# Patient Record
Sex: Male | Born: 1990
Health system: Southern US, Community
[De-identification: ages and names within clinical notes are randomized; demographics above are authoritative.]

## PROBLEM LIST (undated history)

## (undated) HISTORY — PX: OTHER SURGICAL HISTORY: SHX169

---
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
## 2012-08-20 ENCOUNTER — Emergency Department (HOSPITAL_COMMUNITY)
Admission: EM | Admit: 2012-08-20 | Discharge: 2012-08-20 | Disposition: A | Discharge status: Home / Self Care | Payer: BC Managed Care – PPO | Attending: Emergency Medicine | Admitting: Emergency Medicine

## 2012-08-20 ENCOUNTER — Encounter (HOSPITAL_COMMUNITY): Payer: Self-pay | Admitting: *Deleted

## 2012-08-20 ENCOUNTER — Emergency Department (HOSPITAL_COMMUNITY): Payer: BC Managed Care – PPO

## 2012-08-20 DIAGNOSIS — S6990XA Unspecified injury of unspecified wrist, hand and finger(s), initial encounter: Secondary | ICD-10-CM

## 2012-08-20 DIAGNOSIS — Y929 Unspecified place or not applicable: Secondary | ICD-10-CM | POA: Insufficient documentation

## 2012-08-20 DIAGNOSIS — W3302XA Accidental discharge of hunting rifle, initial encounter: Secondary | ICD-10-CM | POA: Insufficient documentation

## 2012-08-20 DIAGNOSIS — S61209A Unspecified open wound of unspecified finger without damage to nail, initial encounter: Secondary | ICD-10-CM | POA: Insufficient documentation

## 2012-08-20 DIAGNOSIS — Y939 Activity, unspecified: Secondary | ICD-10-CM | POA: Insufficient documentation

## 2012-08-20 DIAGNOSIS — F17200 Nicotine dependence, unspecified, uncomplicated: Secondary | ICD-10-CM | POA: Insufficient documentation

## 2012-08-20 MED ORDER — DOXYCYCLINE HYCLATE 100 MG PO CAPS: 100.0000 mg | ORAL_CAPSULE | Freq: Two times a day (BID) | ORAL | Status: DC

## 2012-08-20 MED ORDER — HYDROCODONE-ACETAMINOPHEN 5-325 MG PO TABS
1.0000 | ORAL_TABLET | Freq: Once | ORAL | Status: AC
  Administered 2012-08-20: 1 via ORAL
  Filled 2012-08-20: qty 1

## 2012-08-20 MED ORDER — TRAMADOL HCL 50 MG PO TABS: ORAL_TABLET | ORAL | Status: DC

## 2012-08-20 NOTE — ED Notes (Signed)
Pt dc to home with family.  Pt states understanding to dc paperwork and instructions.  Pt ambulatory to exit without difficulty.  Pt denies need for w/c.
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
## 2012-08-20 NOTE — ED Notes (Signed)
[facility] contacted regarding pt.  They will be coming to speak to pt shortly.  Finger cleaned and wrapped.
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
## 2012-08-20 NOTE — ED Notes (Signed)
[facility] here to speak with pt.

## 2012-08-20 NOTE — ED Notes (Signed)
PT TAKEN TO RADIOLOGY

## 2013-08-14 ENCOUNTER — Ambulatory Visit (INDEPENDENT_AMBULATORY_CARE_PROVIDER_SITE_OTHER): Payer: BC Managed Care – PPO | Admitting: Cardiovascular Disease

## 2013-08-14 ENCOUNTER — Encounter: Payer: Self-pay | Admitting: Cardiovascular Disease

## 2013-08-14 VITALS — BP 100/76 | HR 49 | Ht 69.0 in | Wt 158.0 lb

## 2013-08-14 DIAGNOSIS — R002 Palpitations: Secondary | ICD-10-CM | POA: Insufficient documentation

## 2013-08-14 NOTE — Assessment & Plan Note (Signed)
Noah MaduroRobert presents today for further evaluation and management of palpitations. Clinically, it sounds like he had premature ventricular contractions. He does very physical labor all along. He works for [employer] and lives thousand the pounds boxes during each shift.  The episodes of palpitations occurred when he had been n.p.o. and had his wisdom teeth pulled. His PO  was very poor during those several day.  He is now back on his normal diet he feels quite well.  At this point I do not think that he needs any additional workup. He's completely asymptomatic. If he has any further issues I instructed him to call me. See him on an as-needed basis.

## 2013-08-14 NOTE — Patient Instructions (Signed)
Your physician recommends that you schedule a follow-up appointment in: AS NEEDED BASIS  Your physician recommends that you continue on your current medications as directed. Please refer to the Current Medication list given to you today.    

## 2013-08-14 NOTE — Progress Notes (Signed)
     Noah Jenkins Date of Birth  10-26-1990       [facility]    [place] [facility] [address]  [address], [address] [place], [place]  [address]   [place], [place]  [id number] [phone]     [phone]   Fax  [phone]    Fax [phone]  Problem List: 1. Irregular HR  History of Present Illness:  The patient recently had his wisdom teeth pulled.  He was noticed to have HR irregularities on the monitor.  He has had 1 episode of palps associated with some chest discomfort.    This episode occurred after the wisdom teeth pulling - when he had limited PO intake.  He is back eating normally.  Is not working out yet.  He works at [employer].   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    No current outpatient prescriptions on file prior to visit.   No current facility-administered medications on file prior to visit.    No Known Allergies  No past medical history on file.  No past surgical history on file.  History  Smoking status  . Former Smoker  Smokeless tobacco  . Not on file    History  Alcohol Use  . Yes    No family history on file.  Reviw of Systems:  Reviewed in the HPI.  All other systems are negative.  Physical Exam: Blood pressure 100/76, pulse 49, height 5\' 9"  (1.753 m), weight 158 lb (71.668 kg). Wt Readings from Last 3 Encounters:  08/14/13 158 lb (71.668 kg)     General: Well developed, well nourished, in no acute distress. Head: Normocephalic, atraumatic, sclera non-icteric, mucus membranes are moist,  Neck: Supple. Carotids are 2 + without bruits. No JVD  Lungs: Clear  Heart: RR, S1, S2 Abdomen: Soft, non-tender, non-distended with normal bowel sounds. Msk:  Strength and tone are normal  Extremities: No clubbing or cyanosis. No edema.  Distal pedal pulses are 2+ and equal   Neuro: CN II - XII intact.  Alert and oriented X 3.  Psych:  Normal   ECG: Feb. 9, 2015:  Marked sinus brady at 49.   Otherwise normal.   Assessment / Plan:

## 2013-10-14 ENCOUNTER — Emergency Department (HOSPITAL_COMMUNITY)
Admission: EM | Admit: 2013-10-14 | Discharge: 2013-10-14 | Disposition: A | Discharge status: Home / Self Care | Payer: BC Managed Care – PPO | Attending: Emergency Medicine | Admitting: Emergency Medicine

## 2013-10-14 ENCOUNTER — Encounter (HOSPITAL_COMMUNITY): Payer: Self-pay | Admitting: Emergency Medicine

## 2013-10-14 ENCOUNTER — Emergency Department (HOSPITAL_COMMUNITY): Payer: BC Managed Care – PPO

## 2013-10-14 DIAGNOSIS — IMO0002 Reserved for concepts with insufficient information to code with codable children: Secondary | ICD-10-CM | POA: Insufficient documentation

## 2013-10-14 DIAGNOSIS — Z79899 Other long term (current) drug therapy: Secondary | ICD-10-CM | POA: Insufficient documentation

## 2013-10-14 DIAGNOSIS — Y9389 Activity, other specified: Secondary | ICD-10-CM | POA: Insufficient documentation

## 2013-10-14 DIAGNOSIS — F10929 Alcohol use, unspecified with intoxication, unspecified: Secondary | ICD-10-CM

## 2013-10-14 DIAGNOSIS — R51 Headache: Secondary | ICD-10-CM | POA: Insufficient documentation

## 2013-10-14 DIAGNOSIS — W1789XA Other fall from one level to another, initial encounter: Secondary | ICD-10-CM | POA: Insufficient documentation

## 2013-10-14 DIAGNOSIS — Y929 Unspecified place or not applicable: Secondary | ICD-10-CM | POA: Insufficient documentation

## 2013-10-14 DIAGNOSIS — W1809XA Striking against other object with subsequent fall, initial encounter: Secondary | ICD-10-CM | POA: Insufficient documentation

## 2013-10-14 DIAGNOSIS — F17200 Nicotine dependence, unspecified, uncomplicated: Secondary | ICD-10-CM | POA: Insufficient documentation

## 2013-10-14 DIAGNOSIS — F1010 Alcohol abuse, uncomplicated: Secondary | ICD-10-CM | POA: Insufficient documentation

## 2013-10-14 DIAGNOSIS — S80812A Abrasion, left lower leg, initial encounter: Secondary | ICD-10-CM

## 2013-10-14 DIAGNOSIS — W14XXXA Fall from tree, initial encounter: Secondary | ICD-10-CM

## 2013-10-14 DIAGNOSIS — S0101XA Laceration without foreign body of scalp, initial encounter: Secondary | ICD-10-CM

## 2013-10-14 DIAGNOSIS — S0100XA Unspecified open wound of scalp, initial encounter: Secondary | ICD-10-CM | POA: Insufficient documentation

## 2013-10-14 LAB — URINALYSIS, ROUTINE W REFLEX MICROSCOPIC
Bilirubin Urine: NEGATIVE
GLUCOSE, UA: NEGATIVE mg/dL
HGB URINE DIPSTICK: NEGATIVE
KETONES UR: NEGATIVE mg/dL
Leukocytes, UA: NEGATIVE
Nitrite: NEGATIVE
PROTEIN: NEGATIVE mg/dL
Specific Gravity, Urine: 1.011 (ref 1.005–1.030)
UROBILINOGEN UA: 0.2 mg/dL (ref 0.0–1.0)
pH: 6.5 (ref 5.0–8.0)

## 2013-10-14 LAB — BASIC METABOLIC PANEL
BUN: 10 mg/dL (ref 6–23)
CALCIUM: 9.4 mg/dL (ref 8.4–10.5)
CO2: 22 mEq/L (ref 19–32)
Chloride: 108 mEq/L (ref 96–112)
Creatinine, Ser: 1.11 mg/dL (ref 0.50–1.35)
Glucose, Bld: 122 mg/dL — ABNORMAL HIGH (ref 70–99)
POTASSIUM: 3.4 meq/L — AB (ref 3.7–5.3)
SODIUM: 147 meq/L (ref 137–147)

## 2013-10-14 LAB — CBC WITH DIFFERENTIAL/PLATELET
BASOS ABS: 0 10*3/uL (ref 0.0–0.1)
BASOS PCT: 0 % (ref 0–1)
Band Neutrophils: 0 % (ref 0–10)
Blasts: 0 %
EOS ABS: 0.2 10*3/uL (ref 0.0–0.7)
EOS PCT: 3 % (ref 0–5)
HCT: 40.9 % (ref 39.0–52.0)
HEMOGLOBIN: 15.3 g/dL (ref 13.0–17.0)
Lymphocytes Relative: 47 % — ABNORMAL HIGH (ref 12–46)
Lymphs Abs: 2.6 10*3/uL (ref 0.7–4.0)
MCH: 32.2 pg (ref 26.0–34.0)
MCHC: 37.4 g/dL — ABNORMAL HIGH (ref 30.0–36.0)
MCV: 86.1 fL (ref 78.0–100.0)
METAMYELOCYTES PCT: 0 %
MONO ABS: 0.3 10*3/uL (ref 0.1–1.0)
MONOS PCT: 6 % (ref 3–12)
MYELOCYTES: 0 %
NEUTROS ABS: 2.5 10*3/uL (ref 1.7–7.7)
Neutrophils Relative %: 44 % (ref 43–77)
Platelets: 219 10*3/uL (ref 150–400)
Promyelocytes Absolute: 0 %
RBC: 4.75 MIL/uL (ref 4.22–5.81)
RDW: 12 % (ref 11.5–15.5)
WBC: 5.6 10*3/uL (ref 4.0–10.5)
nRBC: 0 /100 WBC

## 2013-10-14 LAB — ETHANOL: Alcohol, Ethyl (B): 227 mg/dL — ABNORMAL HIGH (ref 0–11)

## 2013-10-14 MED ORDER — ONDANSETRON HCL 4 MG/2ML IJ SOLN
4.0000 mg | Freq: Once | INTRAMUSCULAR | Status: AC
  Administered 2013-10-14: 4 mg via INTRAVENOUS

## 2013-10-14 MED ORDER — ONDANSETRON HCL 4 MG/2ML IJ SOLN
INTRAMUSCULAR | Status: AC
  Administered 2013-10-14: 4 mg via INTRAVENOUS
  Filled 2013-10-14: qty 2

## 2013-10-14 MED ORDER — SODIUM CHLORIDE 0.9 % IV BOLUS (SEPSIS)
1000.0000 mL | Freq: Once | INTRAVENOUS | Status: AC
  Administered 2013-10-14: 1000 mL via INTRAVENOUS

## 2013-10-14 NOTE — Discharge Instructions (Signed)
Tylenol or Ibuprofen for pain.  Staples may be removed in 7-10 days.  Watch for signs of infection:  Redness, bleeding, drainage of pus.  Return to the ER for worsening condition or new concerning symptoms.   Abrasion An abrasion is a cut or scrape of the skin. Abrasions do not extend through all layers of the skin and most heal within 10 days. It is important to care for your abrasion properly to prevent infection. CAUSES  Most abrasions are caused by falling on, or gliding across, the ground or other surface. When your skin rubs on something, the outer and inner layer of skin rubs off, causing an abrasion. DIAGNOSIS  Your caregiver will be able to diagnose an abrasion during a physical exam.  TREATMENT  Your treatment depends on how large and deep the abrasion is. Generally, your abrasion will be cleaned with water and a mild soap to remove any dirt or debris. An antibiotic ointment may be put over the abrasion to prevent an infection. A bandage (dressing) may be wrapped around the abrasion to keep it from getting dirty.  You may need a tetanus shot if:  You cannot remember when you had your last tetanus shot.  You have never had a tetanus shot.  The injury broke your skin. If you get a tetanus shot, your arm may swell, get red, and feel warm to the touch. This is common and not a problem. If you need a tetanus shot and you choose not to have one, there is a rare chance of getting tetanus. Sickness from tetanus can be serious.  HOME CARE INSTRUCTIONS   If a dressing was applied, change it at least once a day or as directed by your caregiver. If the bandage sticks, soak it off with warm water.   Wash the area with water and a mild soap to remove all the ointment 2 times a day. Rinse off the soap and pat the area dry with a clean towel.   Reapply any ointment as directed by your caregiver. This will help prevent infection and keep the bandage from sticking. Use gauze over the wound and  under the dressing to help keep the bandage from sticking.   Change your dressing right away if it becomes wet or dirty.   Only take over-the-counter or prescription medicines for pain, discomfort, or fever as directed by your caregiver.   Follow up with your caregiver within 24 48 hours for a wound check, or as directed. If you were not given a wound-check appointment, look closely at your abrasion for redness, swelling, or pus. These are signs of infection. SEEK IMMEDIATE MEDICAL CARE IF:   You have increasing pain in the wound.   You have redness, swelling, or tenderness around the wound.   You have pus coming from the wound.   You have a fever or persistent symptoms for more than 2 3 days.  You have a fever and your symptoms suddenly get worse.  You have a bad smell coming from the wound or dressing.  MAKE SURE YOU:   Understand these instructions.  Will watch your condition.  Will get help right away if you are not doing well or get worse. Document Released: 04/01/2005 Document Revised: 06/08/2012 Document Reviewed: 05/26/2011 [facility] Patient Information 2014 [place], [place].  Alcohol Intoxication Alcohol intoxication occurs when the amount of alcohol that a person has consumed impairs his or her ability to mentally and physically function. Alcohol directly impairs the normal chemical activity of  the brain. Drinking large amounts of alcohol can lead to changes in mental function and behavior, and it can cause many physical effects that can be harmful.  Alcohol intoxication can range in severity from mild to very severe. Various factors can affect the level of intoxication that occurs, such as the person's age, gender, weight, frequency of alcohol consumption, and the presence of other medical conditions (such as diabetes, seizures, or heart conditions). Dangerous levels of alcohol intoxication may occur when people drink large amounts of alcohol in a short period (binge  drinking). Alcohol can also be especially dangerous when combined with certain prescription medicines or "recreational" drugs. SIGNS AND SYMPTOMS Some common signs and symptoms of mild alcohol intoxication include:  Loss of coordination.  Changes in mood and behavior.  Impaired judgment.  Slurred speech. As alcohol intoxication progresses to more severe levels, other signs and symptoms will appear. These may include:  Vomiting.  Confusion and impaired memory.  Slowed breathing.  Seizures.  Loss of consciousness. DIAGNOSIS  Your health care provider will take a medical history and perform a physical exam. You will be asked about the amount and type of alcohol you have consumed. Blood tests will be done to measure the concentration of alcohol in your blood. In many places, your blood alcohol level must be lower than 80 mg/dL (3.66%0.08%) to legally drive. However, many dangerous effects of alcohol can occur at much lower levels.  TREATMENT  People with alcohol intoxication often do not require treatment. Most of the effects of alcohol intoxication are temporary, and they go away as the alcohol naturally leaves the body. Your health care provider will monitor your condition until you are stable enough to go home. Fluids are sometimes given through an IV access tube to help prevent dehydration.  HOME CARE INSTRUCTIONS  Do not drive after drinking alcohol.  Stay hydrated. Drink enough water and fluids to keep your urine clear or pale yellow. Avoid caffeine.   Only take over-the-counter or prescription medicines as directed by your health care provider.  SEEK MEDICAL CARE IF:   You have persistent vomiting.   You do not feel better after a few days.  You have frequent alcohol intoxication. Your health care provider can help determine if you should see a substance use treatment counselor. SEEK IMMEDIATE MEDICAL CARE IF:   You become shaky or tremble when you try to stop drinking.    You shake uncontrollably (seizure).   You throw up (vomit) blood. This may be bright red or may look like black coffee grounds.   You have blood in your stool. This may be bright red or may appear as a black, tarry, bad smelling stool.   You become lightheaded or faint.  MAKE SURE YOU:   Understand these instructions.  Will watch your condition.  Will get help right away if you are not doing well or get worse. Document Released: 04/01/2005 Document Revised: 02/22/2013 Document Reviewed: 11/25/2012 [facility] Patient Information 2014 [place], [place].  Staple Wound Closure Staples are used to help a wound heal faster by holding the edges of the wound together. HOME CARE  Keep the area around the staples clean and dry.  Rest and raise (elevate) the injured part above the level of your heart.  See your doctor for a follow-up check of the wound.  See your doctor to have the staples removed.  Clean the wound daily with water.  Do not soak the wound in water for long periods of time.  Let air reach the wound as it heals. GET HELP RIGHT AWAY IF:   You have redness or puffiness around the wound.  You have a red line going away from the wound.  You have more pain or tenderness.  You have yellowish-white fluid (pus) coming from the wound.  Your wound does not stay together after the staples have been taken out.  You see something coming out of the wound, such as wood or glass.  You have problems moving the injured area.  You have a fever or lasting symptoms for more than 2-3 days.  You have a fever and your symptoms suddenly get worse. MAKE SURE YOU:   Understand these instructions.  Will watch this condition.  Will get help right away if you are not doing well or get worse. Document Released: 03/31/2008 Document Revised: 03/16/2012 Document Reviewed: 01/03/2012 [facility] Patient Information 2014 [place], [place].
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
## 2014-06-28 ENCOUNTER — Ambulatory Visit (INDEPENDENT_AMBULATORY_CARE_PROVIDER_SITE_OTHER): Payer: BC Managed Care – PPO | Admitting: Physician Assistant

## 2014-06-28 VITALS — BP 124/78 | HR 80 | Temp 99.0°F | Resp 16 | Ht 68.5 in | Wt 157.0 lb

## 2014-06-28 DIAGNOSIS — R070 Pain in throat: Secondary | ICD-10-CM

## 2014-06-28 LAB — POCT RAPID STREP A (OFFICE): RAPID STREP A SCREEN: NEGATIVE

## 2014-06-28 MED ORDER — MAGIC MOUTHWASH W/LIDOCAINE: 5.0000 mL | ORAL | Status: AC | PRN

## 2014-06-28 NOTE — Patient Instructions (Signed)
Upper Respiratory Infection, Adult An upper respiratory infection (URI) is also sometimes known as the common cold. The upper respiratory tract includes the nose, sinuses, throat, trachea, and bronchi. Bronchi are the airways leading to the lungs. Most people improve within 1 week, but symptoms can last up to 2 weeks. A residual cough may last even longer.  CAUSES Many different viruses can infect the tissues lining the upper respiratory tract. The tissues become irritated and inflamed and often become very moist. Mucus production is also common. A cold is contagious. You can easily spread the virus to others by oral contact. This includes kissing, sharing a glass, coughing, or sneezing. Touching your mouth or nose and then touching a surface, which is then touched by another person, can also spread the virus. SYMPTOMS  Symptoms typically develop 1 to 3 days after you come in contact with a cold virus. Symptoms vary from person to person. They may include:  Runny nose.  Sneezing.  Nasal congestion.  Sinus irritation.  Sore throat.  Loss of voice (laryngitis).  Cough.  Fatigue.  Muscle aches.  Loss of appetite.  Headache.  Low-grade fever. DIAGNOSIS  You might diagnose your own cold based on familiar symptoms, since most people get a cold 2 to 3 times a year. Your caregiver can confirm this based on your exam. Most importantly, your caregiver can check that your symptoms are not due to another disease such as strep throat, sinusitis, pneumonia, asthma, or epiglottitis. Blood tests, throat tests, and X-rays are not necessary to diagnose a common cold, but they may sometimes be helpful in excluding other more serious diseases. Your caregiver will decide if any further tests are required. RISKS AND COMPLICATIONS  You may be at risk for a more severe case of the common cold if you smoke cigarettes, have chronic heart disease (such as heart failure) or lung disease (such as asthma), or if  you have a weakened immune system. The very young and very old are also at risk for more serious infections. Bacterial sinusitis, middle ear infections, and bacterial pneumonia can complicate the common cold. The common cold can worsen asthma and chronic obstructive pulmonary disease (COPD). Sometimes, these complications can require emergency medical care and may be life-threatening. PREVENTION  The best way to protect against getting a cold is to practice good hygiene. Avoid oral or hand contact with people with cold symptoms. Wash your hands often if contact occurs. There is no clear evidence that vitamin C, vitamin E, echinacea, or exercise reduces the chance of developing a cold. However, it is always recommended to get plenty of rest and practice good nutrition. TREATMENT  Treatment is directed at relieving symptoms. There is no cure. Antibiotics are not effective, because the infection is caused by a virus, not by bacteria. Treatment may include:  Increased fluid intake. Sports drinks offer valuable electrolytes, sugars, and fluids.  Breathing heated mist or steam (vaporizer or shower).  Eating chicken soup or other clear broths, and maintaining good nutrition.  Getting plenty of rest.  Using gargles or lozenges for comfort.  Controlling fevers with ibuprofen or acetaminophen as directed by your caregiver.  Increasing usage of your inhaler if you have asthma. Zinc gel and zinc lozenges, taken in the first 24 hours of the common cold, can shorten the duration and lessen the severity of symptoms. Pain medicines may help with fever, muscle aches, and throat pain. A variety of non-prescription medicines are available to treat congestion and runny nose. Your caregiver   can make recommendations and may suggest nasal or lung inhalers for other symptoms.  HOME CARE INSTRUCTIONS   Only take over-the-counter or prescription medicines for pain, discomfort, or fever as directed by your  caregiver.  Use a warm mist humidifier or inhale steam from a shower to increase air moisture. This may keep secretions moist and make it easier to breathe.  Drink enough water and fluids to keep your urine clear or pale yellow.  Rest as needed.  Return to work when your temperature has returned to normal or as your caregiver advises. You may need to stay home longer to avoid infecting others. You can also use a face mask and careful hand washing to prevent spread of the virus. SEEK MEDICAL CARE IF:   After the first few days, you feel you are getting worse rather than better.  You need your caregiver's advice about medicines to control symptoms.  You develop chills, worsening shortness of breath, or brown or red sputum. These may be signs of pneumonia.  You develop yellow or brown nasal discharge or pain in the face, especially when you bend forward. These may be signs of sinusitis.  You develop a fever, swollen neck glands, pain with swallowing, or white areas in the back of your throat. These may be signs of strep throat. SEEK IMMEDIATE MEDICAL CARE IF:   You have a fever.  You develop severe or persistent headache, ear pain, sinus pain, or chest pain.  You develop wheezing, a prolonged cough, cough up blood, or have a change in your usual mucus (if you have chronic lung disease).  You develop sore muscles or a stiff neck. Document Released: 12/16/2000 Document Revised: 09/14/2011 Document Reviewed: 09/27/2013 [employer] Patient Information 2015 [employer]. This information is not intended to replace advice given to you by your health care provider. Make sure you discuss any questions you have with your health care provider.  

## 2014-06-28 NOTE — Progress Notes (Signed)
MRN: [id number] DOB: 1990-11-28  Subjective:   Chief Complaint  Patient presents with  . Fever    up to 100.7  . Sore Throat    started yesterday    Noah Jenkins is a 23 y.o. male presenting for 2 days of sore throat.  He states that it began with some body aches and general soreness.  Yesterday, had significant pain in his throat.  He also had fever around 100.7 by late afternoon.  Throat pain worsened throughout the day.  He has been able to eat, but has some associated nausea, but no vomiting or diarrhea.  He also has some stuffy nose with dark yellow sputum.  Yesterday he took tylenol which helped.  Hx of strepthroat as child.  No known sick contacts.  He has ear fullness with the sensation that his ears are being pushed out.  He has some nausea but no vomiting or diarrhea.  He states that he has some sob, but no dizziness.    2 weeks smoke cessation.  Hx smoker of 4 cigarettes per day  Denies any other aggravating or relieving factors, no other questions or concerns.  Noah Jenkins currently has no medications in their medication list.  He has No Known Allergies.  Noah Jenkins  has no past medical history on file. Also  has no past surgical history on file.  ROS As in subjective.  Objective:   Vitals: BP 124/78 mmHg  Pulse 80  Temp(Src) 99 F (37.2 C) (Oral)  Resp 16  Ht 5' 8.5" (1.74 m)  Wt 157 lb (71.215 kg)  BMI 23.52 kg/m2  SpO2 99%  Physical Exam  Constitutional: He is well-developed, well-nourished, and in no distress. No distress.  HENT:  Right Ear: Tympanic membrane is not injected and not bulging. No middle ear effusion.  Left Ear: Tympanic membrane is not injected and not bulging.  No middle ear effusion.  Nose: Mucosal edema and rhinorrhea present.  Mouth/Throat: No trismus in the jaw. No uvula swelling. Posterior oropharyngeal edema and posterior oropharyngeal erythema present. No oropharyngeal exudate.  Cardiovascular: Normal rate and regular rhythm.  Exam  reveals no gallop, no distant heart sounds and no friction rub.   No murmur heard. Pulmonary/Chest: Effort normal and breath sounds normal. No accessory muscle usage. No apnea. No respiratory distress. He has no wheezes.  Abdominal: Soft. Bowel sounds are normal. There is no hepatosplenomegaly. There is tenderness (Mild ruq tenderness.). There is negative Murphy's sign.  Lymphadenopathy:       Head (right side): No submental and no submandibular adenopathy present.       Head (left side): Tonsillar adenopathy present. No submental and no submandibular adenopathy present.       Right cervical: No superficial cervical and no posterior cervical adenopathy present.      Left cervical: No superficial cervical and no posterior cervical adenopathy present.  Skin: Skin is warm, dry and intact.  Psychiatric: Mood, memory, affect and judgment normal.    Results for orders placed or performed in visit on 06/28/14  POCT rapid strep A  Result Value Ref Range   Rapid Strep A Screen Negative Negative      Assessment and Plan :   23 year old male with pmh listed above is here today for chief complaint of sorethroat and nasal congestion that began 2 days ago.  This appears to be viral in etiology.  Will treat him supportively with mouthwash w/lidocaine.  Also advised to have mucinex and other supportive  measures.  Will rtc if worsens or not improved in 8 days.  Throat culture sent.  Temperature upon exiting was 98.2 oral.    Throat pain - Plan: POCT rapid strep A, Throat culture Loney Loh([facility]), Alum & Mag Hydroxide-Simeth (MAGIC MOUTHWASH W/LIDOCAINE) SOLN  Trena PlattStephanie English, PA-C [facility] 12/24/20152:47 PM

## 2014-06-30 ENCOUNTER — Encounter: Payer: Self-pay | Admitting: Physician Assistant

## 2014-07-02 ENCOUNTER — Telehealth: Payer: Self-pay | Admitting: Physician Assistant

## 2014-07-02 LAB — CULTURE, GROUP A STREP: Organism ID, Bacteria: NORMAL

## 2014-07-02 NOTE — Telephone Encounter (Signed)
Spoke to patient regarding negative throat culture.  Patient states that he is feeling much better at day 6 of presumed ur virus.  He continues to have some throat pain and congestion, but is clearing up each day.  He denies fever.  Advised to rtc if worsening symptoms.

## 2018-03-25 ENCOUNTER — Other Ambulatory Visit: Payer: Self-pay

## 2018-03-25 ENCOUNTER — Encounter (HOSPITAL_BASED_OUTPATIENT_CLINIC_OR_DEPARTMENT_OTHER): Payer: Self-pay | Admitting: *Deleted

## 2018-03-25 ENCOUNTER — Emergency Department (HOSPITAL_BASED_OUTPATIENT_CLINIC_OR_DEPARTMENT_OTHER)
Admission: EM | Admit: 2018-03-25 | Discharge: 2018-03-25 | Disposition: A | Discharge status: Home / Self Care | Payer: Self-pay | Attending: Emergency Medicine | Admitting: Emergency Medicine

## 2018-03-25 ENCOUNTER — Emergency Department (HOSPITAL_BASED_OUTPATIENT_CLINIC_OR_DEPARTMENT_OTHER): Payer: Self-pay

## 2018-03-25 DIAGNOSIS — F17210 Nicotine dependence, cigarettes, uncomplicated: Secondary | ICD-10-CM | POA: Insufficient documentation

## 2018-03-25 DIAGNOSIS — N10 Acute tubulo-interstitial nephritis: Secondary | ICD-10-CM | POA: Insufficient documentation

## 2018-03-25 DIAGNOSIS — R1084 Generalized abdominal pain: Secondary | ICD-10-CM | POA: Insufficient documentation

## 2018-03-25 LAB — URINALYSIS, MICROSCOPIC (REFLEX)

## 2018-03-25 LAB — CBC WITH DIFFERENTIAL/PLATELET
Basophils Absolute: 0 10*3/uL (ref 0.0–0.1)
Basophils Relative: 0 %
EOS ABS: 0.1 10*3/uL (ref 0.0–0.7)
EOS PCT: 1 %
HCT: 42 % (ref 39.0–52.0)
Hemoglobin: 15.5 g/dL (ref 13.0–17.0)
LYMPHS ABS: 1.9 10*3/uL (ref 0.7–4.0)
LYMPHS PCT: 12 %
MCH: 32 pg (ref 26.0–34.0)
MCHC: 36.9 g/dL — AB (ref 30.0–36.0)
MCV: 86.6 fL (ref 78.0–100.0)
MONO ABS: 1.4 10*3/uL — AB (ref 0.1–1.0)
MONOS PCT: 9 %
Neutro Abs: 12.1 10*3/uL — ABNORMAL HIGH (ref 1.7–7.7)
Neutrophils Relative %: 78 %
PLATELETS: 281 10*3/uL (ref 150–400)
RBC: 4.85 MIL/uL (ref 4.22–5.81)
RDW: 12.1 % (ref 11.5–15.5)
WBC: 15.4 10*3/uL — AB (ref 4.0–10.5)

## 2018-03-25 LAB — BASIC METABOLIC PANEL
Anion gap: 10 (ref 5–15)
BUN: 13 mg/dL (ref 6–20)
CO2: 25 mmol/L (ref 22–32)
CREATININE: 0.96 mg/dL (ref 0.61–1.24)
Calcium: 9.4 mg/dL (ref 8.9–10.3)
Chloride: 104 mmol/L (ref 98–111)
GFR calc Af Amer: >60 mL/min (ref >60)
Glucose, Bld: 101 mg/dL — ABNORMAL HIGH (ref 70–99)
POTASSIUM: 3.5 mmol/L (ref 3.5–5.1)
SODIUM: 139 mmol/L (ref 135–145)

## 2018-03-25 LAB — URINALYSIS, ROUTINE W REFLEX MICROSCOPIC
Bilirubin Urine: NEGATIVE
GLUCOSE, UA: 100 mg/dL — AB
Ketones, ur: NEGATIVE mg/dL
Nitrite: POSITIVE — AB
PH: 8 (ref 5.0–8.0)
PROTEIN: 100 mg/dL — AB
Specific Gravity, Urine: 1.01 (ref 1.005–1.030)

## 2018-03-25 MED ORDER — CEPHALEXIN 500 MG PO CAPS: 500.0000 mg | ORAL_CAPSULE | Freq: Four times a day (QID) | ORAL | 0 refills | Status: AC

## 2018-03-25 MED ORDER — ACETAMINOPHEN 500 MG PO TABS
1000.0000 mg | ORAL_TABLET | Freq: Once | ORAL | Status: AC
  Administered 2018-03-25: 1000 mg via ORAL
  Filled 2018-03-25: qty 2

## 2018-03-25 MED FILL — CEPHALEXIN 500 MG CAPSULE: 500 | 7 days supply | Qty: 28 | Fill #0

## 2018-03-25 NOTE — ED Provider Notes (Signed)
[facility] EMERGENCY DEPARTMENT Provider Note   CSN: [id number] Arrival date & time: 03/25/18  1001     History   Chief Complaint Chief Complaint  Patient presents with  . Back Pain    HPI Noah Jenkins is a 27 y.o. male.  The history is provided by the patient.  Dysuria   This is a new problem. The current episode started 2 days ago. The problem occurs intermittently. The problem has not changed since onset.The quality of the pain is described as burning. The pain is at a severity of 2/10. The pain is mild. There has been no fever. The fever has been present for less than 1 day. He is sexually active. There is no history of pyelonephritis. Associated symptoms include flank pain. Pertinent negatives include no chills, no sweats, no nausea, no vomiting, no discharge, no frequency, no hematuria, no hesitancy, no possible pregnancy and no urgency. Treatments tried: urostat. His past medical history does not include kidney stones or recurrent UTIs.    History reviewed. No pertinent past medical history.  Patient Active Problem List   Diagnosis Date Noted  . Palpitations 08/14/2013    Past Surgical History:  Procedure Laterality Date  . RIght Knee minicus repair          Home Medications    Prior to Admission medications   Medication Sig Start Date End Date Taking? Authorizing Provider  Alum & Mag Hydroxide-Simeth (MAGIC MOUTHWASH W/LIDOCAINE) SOLN Take 5 mLs by mouth every 2 (two) hours as needed for mouth pain. 06/28/14   Trena PlattEnglish, Stephanie D, PA  cephALEXin (KEFLEX) 500 MG capsule Take 1 capsule (500 mg total) by mouth 4 (four) times daily for 7 days. 03/25/18 04/01/18  Virgina Norfolkuratolo, Arran Fessel, DO    Family History History reviewed. No pertinent family history.  Social History Social History   Tobacco Use  . Smoking status: Current Every Day Smoker    Packs/day: 0.50    Types: Cigarettes    Last attempt to quit: 09/27/2013    Years since quitting: 4.4  .  Smokeless tobacco: Never Used  . Tobacco comment: 1 ppd  Substance Use Topics  . Alcohol use: Yes    Alcohol/week: 3.0 - 4.0 standard drinks    Types: 3 - 4 Cans of beer per week    Comment: daily  . Drug use: Not Currently    Types: Marijuana    Comment: not in 2.5 weeks     Allergies   Patient has no known allergies.   Review of Systems Review of Systems  Constitutional: Negative for chills and fever.  HENT: Negative for ear pain and sore throat.   Eyes: Negative for pain and visual disturbance.  Respiratory: Negative for cough and shortness of breath.   Cardiovascular: Negative for chest pain and palpitations.  Gastrointestinal: Negative for abdominal pain, nausea and vomiting.  Genitourinary: Positive for dysuria and flank pain. Negative for difficulty urinating, discharge, frequency, hematuria, hesitancy, penile pain, penile swelling, scrotal swelling, testicular pain and urgency.  Musculoskeletal: Negative for arthralgias and back pain.  Skin: Negative for color change and rash.  Neurological: Negative for seizures and syncope.  All other systems reviewed and are negative.    Physical Exam Updated Vital Signs  ED Triage Vitals  Enc Vitals Group     BP 03/25/18 1007 132/85     Pulse Rate 03/25/18 1007 86     Resp 03/25/18 1007 18     Temp 03/25/18 1007 98.4 F (36.9 C)  Temp Source 03/25/18 1007 Oral     SpO2 03/25/18 1007 100 %     Weight 03/25/18 1005 185 lb (83.9 kg)     Height 03/25/18 1005 5\' 9"  (1.753 m)     Head Circumference --      Peak Flow --      Pain Score --      Pain Loc --      Pain Edu? --      Excl. in GC? --     Physical Exam  Constitutional: He is oriented to person, place, and time. He appears well-developed and well-nourished.  HENT:  Head: Normocephalic and atraumatic.  Mouth/Throat: No oropharyngeal exudate.  Eyes: Pupils are equal, round, and reactive to light. Conjunctivae and EOM are normal.  Neck: Normal range of motion.  Neck supple.  Cardiovascular: Normal rate, regular rhythm, normal heart sounds and intact distal pulses.  No murmur heard. Pulmonary/Chest: Effort normal and breath sounds normal. No respiratory distress.  Abdominal: Soft. He exhibits no distension. There is no tenderness.  Musculoskeletal: Normal range of motion. He exhibits tenderness (TTP in right CVA). He exhibits no edema.  Neurological: He is alert and oriented to person, place, and time.  Skin: Skin is warm and dry. Capillary refill takes less than 2 seconds.  Psychiatric: He has a normal mood and affect.  Nursing note and vitals reviewed.    ED Treatments / Results  Labs (all labs ordered are listed, but only abnormal results are displayed) Labs Reviewed  URINALYSIS, ROUTINE W REFLEX MICROSCOPIC - Abnormal; Notable for the following components:      Result Value   Color, Urine ORANGE (*)    APPearance HAZY (*)    Glucose, UA 100 (*)    Hgb urine dipstick MODERATE (*)    Protein, ur 100 (*)    Nitrite POSITIVE (*)    Leukocytes, UA LARGE (*)    All other components within normal limits  URINALYSIS, MICROSCOPIC (REFLEX) - Abnormal; Notable for the following components:   Bacteria, UA MANY (*)    All other components within normal limits  CBC WITH DIFFERENTIAL/PLATELET - Abnormal; Notable for the following components:   WBC 15.4 (*)    MCHC 36.9 (*)    Neutro Abs 12.1 (*)    Monocytes Absolute 1.4 (*)    All other components within normal limits  BASIC METABOLIC PANEL - Abnormal; Notable for the following components:   Glucose, Bld 101 (*)    All other components within normal limits  URINE CULTURE    EKG None  Radiology Ct Renal Stone Study  Result Date: 03/25/2018 CLINICAL DATA:  Right flank pain and dysuria. EXAM: CT ABDOMEN AND PELVIS WITHOUT CONTRAST TECHNIQUE: Multidetector CT imaging of the abdomen and pelvis was performed following the standard protocol without IV contrast. COMPARISON:  None. FINDINGS:  Lower chest: No acute abnormality. Hepatobiliary: No focal liver abnormality is seen. No gallstones, gallbladder Smoot thickening, or biliary dilatation. Pancreas: Unremarkable. No pancreatic ductal dilatation or surrounding inflammatory changes. Spleen: Normal in size without focal abnormality. Adrenals/Urinary Tract: Adrenal glands are unremarkable. Kidneys are normal, without renal calculi, focal lesion, or hydronephrosis. Bladder is unremarkable. Stomach/Bowel: Stomach is within normal limits. Appendix appears normal. No evidence of bowel Tome thickening, distention, or inflammatory changes. Vascular/Lymphatic: No significant vascular findings are present. No enlarged abdominal or pelvic lymph nodes. Reproductive: Prostate is unremarkable. Other: Tiny fat containing left inguinal hernia. No free fluid or pneumoperitoneum. Musculoskeletal: No acute or significant osseous findings.  IMPRESSION: 1.  No acute intra-abdominal process.  No urolithiasis. Electronically Signed   By: Obie Dredge M.D.   On: 03/25/2018 11:27    Procedures Procedures (including critical care time)  Medications Ordered in ED Medications  acetaminophen (TYLENOL) tablet 1,000 mg (1,000 mg Oral Given 03/25/18 1100)     Initial Impression / Assessment and Plan / ED Course  I have reviewed the triage vital signs and the nursing notes.  Pertinent labs & imaging results that were available during my care of the patient were reviewed by me and considered in my medical decision making (see chart for details).     Noah Jenkins is a 27 year old male with no significant medical history who presents to the ED with pain with urination, right flank pain.  Patient with normal vitals.  No fever.  Patient with symptoms for the last several days.  No history of kidney stones.  But has right CVA tenderness on exam.  Otherwise overall well-appearing.  Lab work shows leukocytosis and urinary tract infection.  CT scan did not show any signs of  kidney stones.  Overall uncomplicated pyonephritis.  Patient is able to tolerate p.o.  Had no significant anemia, electrolyte abnormality.  Will give Keflex and recommend follow-up with primary care doctor and discharged in ED in good condition.  This chart was dictated using voice recognition software.  Despite best efforts to proofread,  errors can occur which can change the documentation meaning.   Final Clinical Impressions(s) / ED Diagnoses   Final diagnoses:  Acute pyelonephritis    ED Discharge Orders         Ordered    cephALEXin (KEFLEX) 500 MG capsule  4 times daily     03/25/18 1212           Braelin Costlow, DO 03/25/18 1340

## 2018-03-25 NOTE — ED Triage Notes (Signed)
Right flank pain and frequent burning sensation with urination started yesterday.  Took Urostat OTC yesterday.

## 2018-03-27 LAB — URINE CULTURE

## 2018-03-28 ENCOUNTER — Telehealth: Payer: Self-pay | Admitting: Emergency Medicine

## 2018-03-28 NOTE — Telephone Encounter (Signed)
Post ED Visit - Positive Culture Follow-up  Culture report reviewed by antimicrobial stewardship pharmacist:  []  Enzo BiNathan Batchelder, Pharm.D. []  Celedonio MiyamotoJeremy Frens, Pharm.D., BCPS AQ-ID []  Garvin FilaMike Maccia, Pharm.D., BCPS []  Georgina PillionElizabeth Martin, Pharm.D., BCPS []  BuckholtsMinh Pham, VermontPharm.D., BCPS, AAHIVP []  Estella HuskMichelle Turner, Pharm.D., BCPS, AAHIVP []  Lysle Pearlachel Rumbarger, PharmD, BCPS []  Phillips Climeshuy Dang, PharmD, BCPS [x]  Agapito GamesAlison Masters, PharmD, BCPS []  Verlan FriendsErin Deja, PharmD  Positive urine culture Treated with cephalexin, organism sensitive to the same and no further patient follow-up is required at this time.  Berle MullMiller, Jamier Urbas 03/28/2018, 3:13 PM

## 2019-02-26 ENCOUNTER — Other Ambulatory Visit: Payer: Self-pay

## 2019-02-26 ENCOUNTER — Encounter (HOSPITAL_BASED_OUTPATIENT_CLINIC_OR_DEPARTMENT_OTHER): Payer: Self-pay | Admitting: Emergency Medicine

## 2019-02-26 ENCOUNTER — Emergency Department (HOSPITAL_BASED_OUTPATIENT_CLINIC_OR_DEPARTMENT_OTHER)
Admission: EM | Admit: 2019-02-26 | Discharge: 2019-02-26 | Disposition: A | Discharge status: Home / Self Care | Payer: BC Managed Care – PPO | Attending: Emergency Medicine | Admitting: Emergency Medicine

## 2019-02-26 ENCOUNTER — Emergency Department (HOSPITAL_BASED_OUTPATIENT_CLINIC_OR_DEPARTMENT_OTHER): Payer: BC Managed Care – PPO

## 2019-02-26 DIAGNOSIS — Y9389 Activity, other specified: Secondary | ICD-10-CM | POA: Insufficient documentation

## 2019-02-26 DIAGNOSIS — Y998 Other external cause status: Secondary | ICD-10-CM | POA: Insufficient documentation

## 2019-02-26 DIAGNOSIS — Z23 Encounter for immunization: Secondary | ICD-10-CM | POA: Insufficient documentation

## 2019-02-26 DIAGNOSIS — Y9289 Other specified places as the place of occurrence of the external cause: Secondary | ICD-10-CM | POA: Insufficient documentation

## 2019-02-26 DIAGNOSIS — F17210 Nicotine dependence, cigarettes, uncomplicated: Secondary | ICD-10-CM | POA: Insufficient documentation

## 2019-02-26 DIAGNOSIS — S61211A Laceration without foreign body of left index finger without damage to nail, initial encounter: Secondary | ICD-10-CM | POA: Diagnosis not present

## 2019-02-26 DIAGNOSIS — W260XXA Contact with knife, initial encounter: Secondary | ICD-10-CM | POA: Diagnosis not present

## 2019-02-26 MED ORDER — LIDOCAINE HCL (PF) 1 % IJ SOLN
5.0000 mL | Freq: Once | INTRAMUSCULAR | Status: AC
  Administered 2019-02-26: 5 mL
  Filled 2019-02-26: qty 5

## 2019-02-26 MED ORDER — BACITRACIN ZINC 500 UNIT/GM EX OINT
TOPICAL_OINTMENT | Freq: Two times a day (BID) | CUTANEOUS | Status: DC
  Administered 2019-02-26: 21:00:00 via TOPICAL

## 2019-02-26 MED ORDER — TETANUS-DIPHTH-ACELL PERTUSSIS 5-2.5-18.5 LF-MCG/0.5 IM SUSP
0.5000 mL | Freq: Once | INTRAMUSCULAR | Status: AC
  Administered 2019-02-26: 0.5 mL via INTRAMUSCULAR
  Filled 2019-02-26: qty 0.5

## 2019-02-26 NOTE — Discharge Instructions (Addendum)
Return in 7 to 9 days for suture removal. Return to the ED sooner if you start to have signs of infection including redness, red streaks, increased swelling, drainage from the area or fever.

## 2019-02-26 NOTE — ED Triage Notes (Signed)
Was using a new knife to cut watermelon and cut left index finger posteriorly. Bleeding controlled in triage.

## 2019-02-26 NOTE — ED Provider Notes (Signed)
[facility] Provider Note   CSN: [id number] Arrival date & time: 02/26/19  1911     History   Chief Complaint Chief Complaint  Patient presents with  . Laceration    HPI Noah Jenkins is a 28 y.o. male who presents to ED for nondominant left index finger laceration that he sustained with a kitchen knife.  States that he was using a very sharp knife to cut watermelon when he cut his finger near the PIP joint of the left index finger.  Able to wash with warm water.  States that tetanus is not up-to-date.  Denies any other complaints.     HPI  History reviewed. No pertinent past medical history.  Patient Active Problem List   Diagnosis Date Noted  . Palpitations 08/14/2013    Past Surgical History:  Procedure Laterality Date  . RIght Knee minicus repair          Home Medications    Prior to Admission medications   Medication Sig Start Date End Date Taking? Authorizing Provider  Alum & Mag Hydroxide-Simeth (MAGIC MOUTHWASH W/LIDOCAINE) SOLN Take 5 mLs by mouth every 2 (two) hours as needed for mouth pain. 06/28/14   Joretta Bachelor, PA    Family History No family history on file.  Social History Social History   Tobacco Use  . Smoking status: Current Every Day Smoker    Packs/day: 0.50    Types: Cigarettes    Last attempt to quit: 09/27/2013    Years since quitting: 5.4  . Smokeless tobacco: Never Used  . Tobacco comment: 1 ppd  Substance Use Topics  . Alcohol use: Yes    Alcohol/week: 3.0 - 4.0 standard drinks    Types: 3 - 4 Cans of beer per week    Comment: daily  . Drug use: Not Currently    Types: Marijuana    Comment: not in 2.5 weeks     Allergies   Patient has no known allergies.   Review of Systems Review of Systems  Constitutional: Negative for chills and fever.  Musculoskeletal: Negative for myalgias.  Skin: Positive for wound.  Neurological: Negative for weakness and numbness.     Physical Exam  Updated Vital Signs BP 134/72   Pulse 91   Temp 98 F (36.7 C) (Oral)   Resp 18   Ht 5\' 9"  (1.753 m)   Wt 86.2 kg   SpO2 98%   BMI 28.06 kg/m   Physical Exam Vitals signs and nursing note reviewed.  Constitutional:      General: He is not in acute distress.    Appearance: He is well-developed. He is not diaphoretic.  HENT:     Head: Normocephalic and atraumatic.  Eyes:     General: No scleral icterus.    Conjunctiva/sclera: Conjunctivae normal.  Neck:     Musculoskeletal: Normal range of motion.  Pulmonary:     Effort: Pulmonary effort is normal. No respiratory distress.  Skin:    Findings: No rash.     Comments: ~2cm linear laceration over the PIP joint of the L 2nd digit. Able to flex and extend digit without difficulty.  Sensation intact to light touch over digit. 2+ radial pulse noted.  Neurological:     Mental Status: He is alert.      ED Treatments / Results  Labs (all labs ordered are listed, but only abnormal results are displayed) Labs Reviewed - No data to display  EKG None  Radiology Dg  Hand Complete Left  Result Date: 02/26/2019 CLINICAL DATA:  Index finger laceration EXAM: LEFT HAND - COMPLETE 3+ VIEW COMPARISON:  None. FINDINGS: No fracture or dislocation of the left hand. Joint spaces are well preserved. Soft tissue laceration of the index digit with soft tissue edema. No radiopaque foreign body identified. IMPRESSION: No fracture or dislocation of the left hand. Joint spaces are well preserved. Soft tissue laceration of the index digit with soft tissue edema. No radiopaque foreign body identified. Electronically Signed   By: Lauralyn PrimesAlex  Bibbey M.D.   On: 02/26/2019 19:58    Procedures .Marland Kitchen.Laceration Repair  Date/Time: 02/26/2019 8:58 PM Performed by: Dietrich PatesKhatri, Macari Zalesky, PA-C Authorized by: Dietrich PatesKhatri, Amilyah Nack, PA-C   Consent:    Consent obtained:  Verbal   Consent given by:  Patient   Risks discussed:  Infection, need for additional repair, nerve damage, pain,  poor wound healing, poor cosmetic result, retained foreign body, tendon damage and vascular damage Anesthesia (see MAR for exact dosages):    Anesthesia method:  Local infiltration   Local anesthetic:  Lidocaine 1% w/o epi Laceration details:    Location:  Finger   Finger location:  L index finger   Length (cm):  2 Repair type:    Repair type:  Simple Exploration:    Hemostasis achieved with:  Direct pressure   Wound exploration: wound explored through full range of motion     Wound extent: no tendon damage noted and no underlying fracture noted   Treatment:    Area cleansed with:  Saline   Amount of cleaning:  Extensive   Irrigation solution:  Sterile saline   Irrigation method:  Pressure wash Skin repair:    Repair method:  Sutures   Suture size:  5-0   Suture material:  Nylon   Suture technique:  Simple interrupted   Number of sutures:  3 Approximation:    Approximation:  Close Post-procedure details:    Dressing:  Splint for protection and antibiotic ointment   (including critical care time)  Medications Ordered in ED Medications  bacitracin ointment (has no administration in time range)  lidocaine (PF) (XYLOCAINE) 1 % injection 5 mL (5 mLs Infiltration Given by Other 02/26/19 1948)  Tdap (BOOSTRIX) injection 0.5 mL (0.5 mLs Intramuscular Given 02/26/19 1947)     Initial Impression / Assessment and Plan / ED Course  I have reviewed the triage vital signs and the nursing notes.  Pertinent labs & imaging results that were available during my care of the patient were reviewed by me and considered in my medical decision making (see chart for details).        Patient counseled on wound care. Patient counseled on need to return or see PCP/urgent care for suture removal in 7 days. Patient was urged to return to the Emergency Department urgently with worsening pain, swelling, expanding erythema especially if it streaks away from the affected area, fever, or if they have any  other concerns. Patient verbalized understanding.   Patient is hemodynamically stable, in NAD, and able to ambulate in the ED. Evaluation does not show pathology that would require ongoing emergent intervention or inpatient treatment. I explained the diagnosis to the patient. Pain has been managed and has no complaints prior to discharge. Patient is comfortable with above plan and is stable for discharge at this time. All questions were answered prior to disposition. Strict return precautions for returning to the ED were discussed. Encouraged follow up with PCP.   An After Visit Summary was  printed and given to the patient.   Portions of this note were generated with Scientist, clinical (histocompatibility and immunogenetics)Dragon dictation software. Dictation errors may occur despite best attempts at proofreading.  Final Clinical Impressions(s) / ED Diagnoses   Final diagnoses:  Laceration of left index finger without foreign body without damage to nail, initial encounter    ED Discharge Orders    None       Dietrich PatesKhatri, Rosela Supak, PA-C 02/26/19 2059    Vanetta MuldersZackowski, Scott, MD 03/04/19 1230

## 2019-05-23 IMAGING — CT CT RENAL STONE PROTOCOL
2 of 4 series · 16 of 46 positions shown, 18 images · non-contrast
Comparison: None.

CLINICAL DATA: Right flank pain and dysuria.

EXAM:
CT ABDOMEN AND PELVIS WITHOUT CONTRAST
TECHNIQUE: Multidetector CT imaging of the abdomen and pelvis was performed
following the standard protocol without IV contrast.

[Series 2: axial st · axial · 0.76mm/px · z∈[+718,+1142]mm · 13 of 93 slices shown, 15 images]
[im 4/93  soft-tissue]
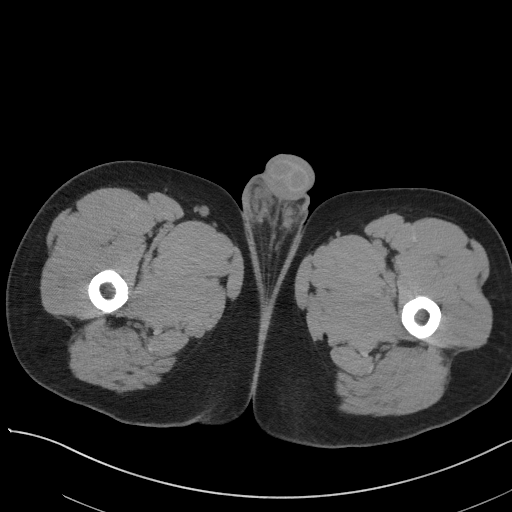
[im 4/93  bone]
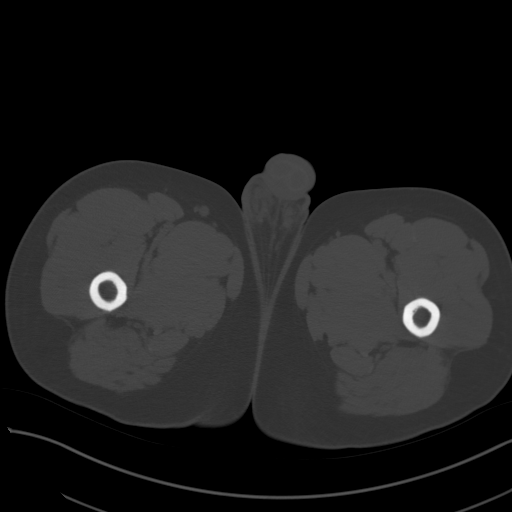
[im 11/93  soft-tissue]
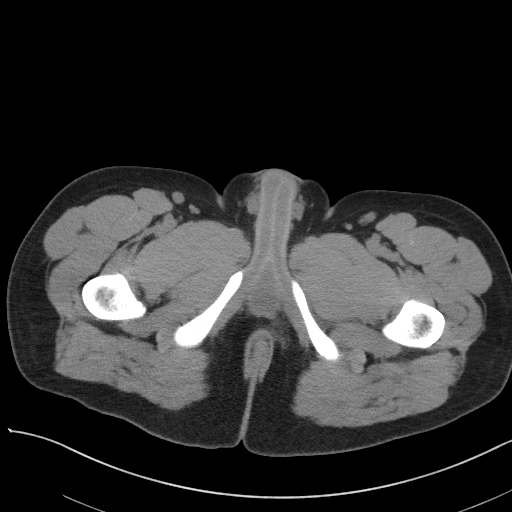
[im 18/93  soft-tissue]
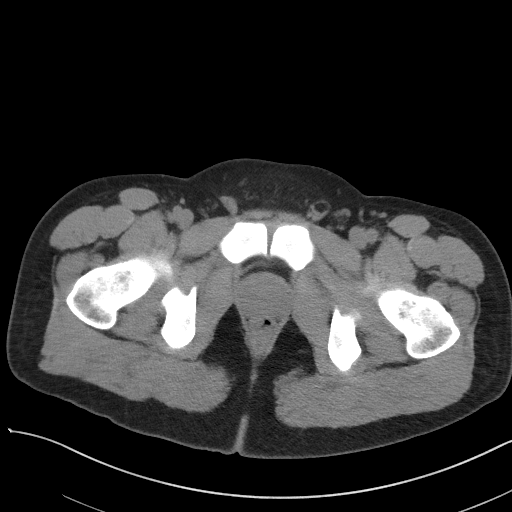
[im 25/93  soft-tissue]
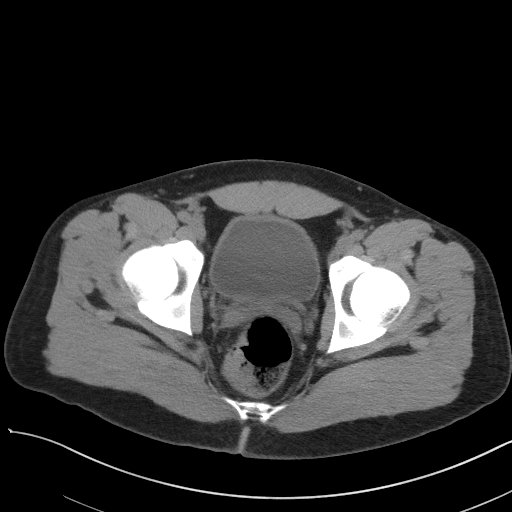
[im 32/93  soft-tissue]
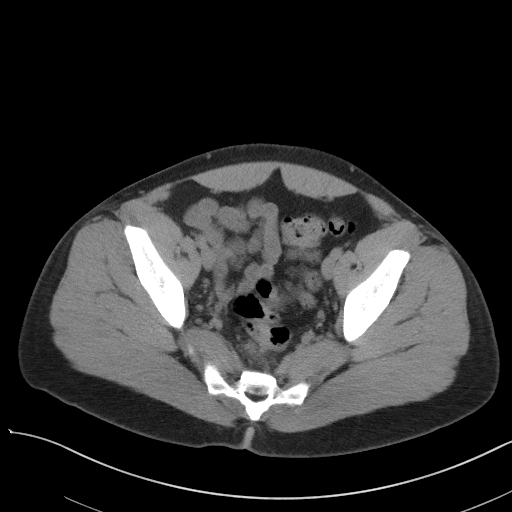
[im 39/93  soft-tissue]
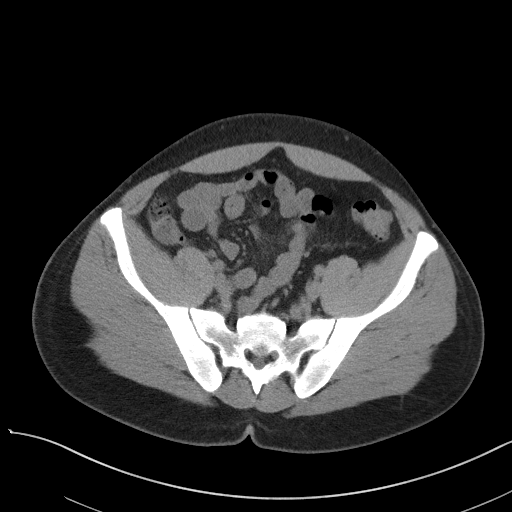
[im 47/93  soft-tissue]
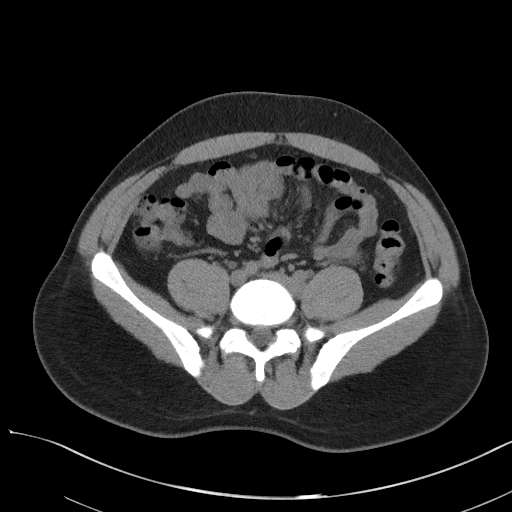
[im 54/93  soft-tissue]
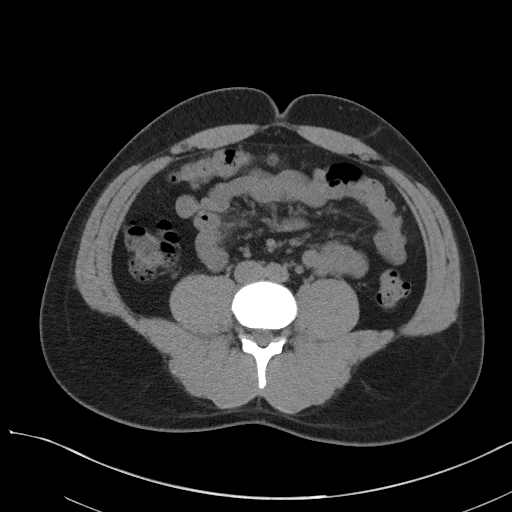
[im 61/93  soft-tissue]
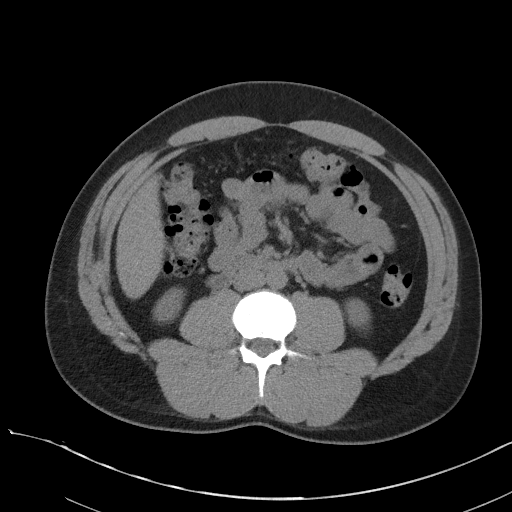
[im 61/93  bone]
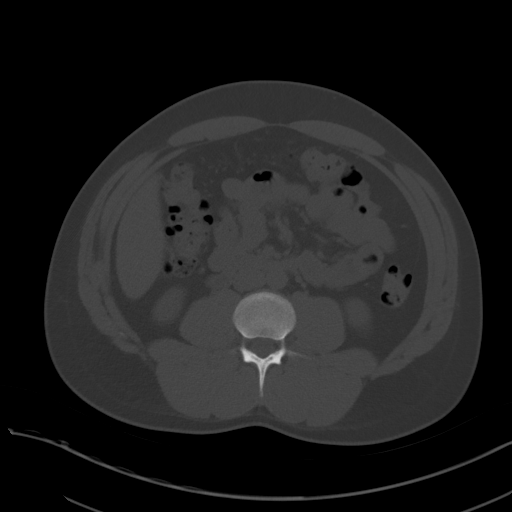
[im 68/93  soft-tissue]
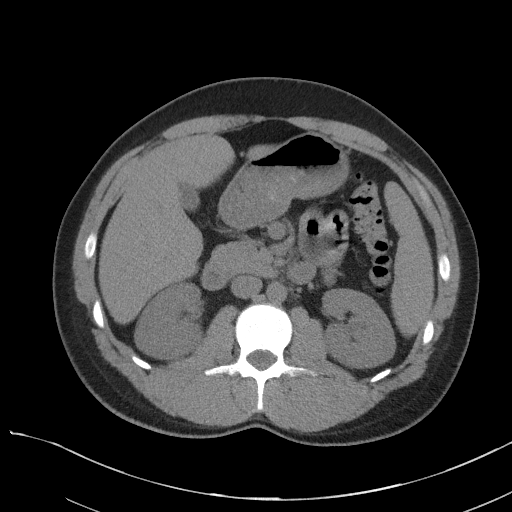
[im 75/93  soft-tissue]
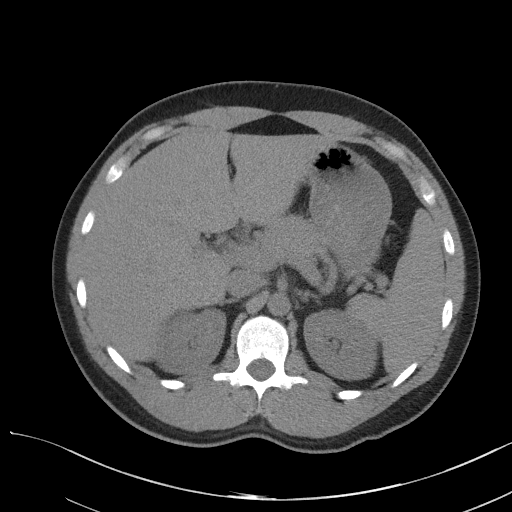
[im 82/93  soft-tissue]
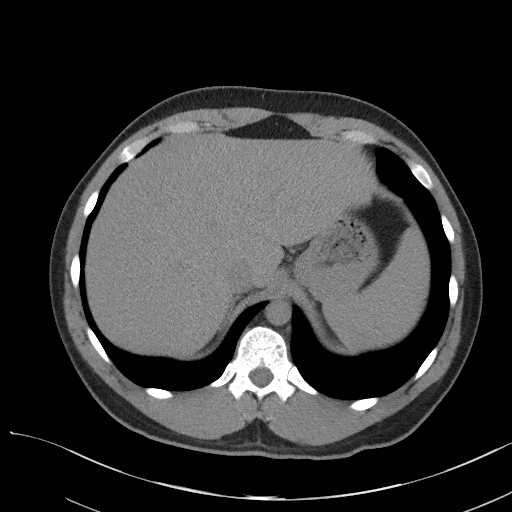
[im 89/93  soft-tissue]
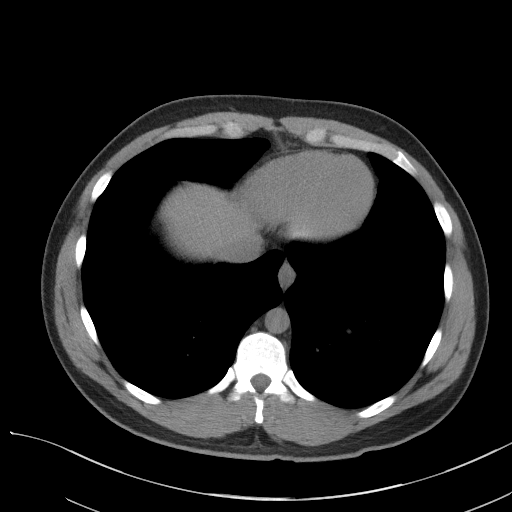

[Series 5: coronal st · coronal · 0.76mm/px · 3 of 94 slices shown]
[im 32/94  soft-tissue]
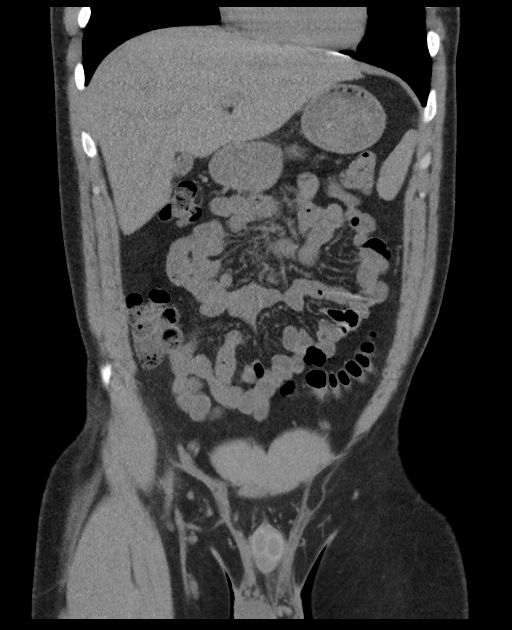
[im 42/94  soft-tissue]
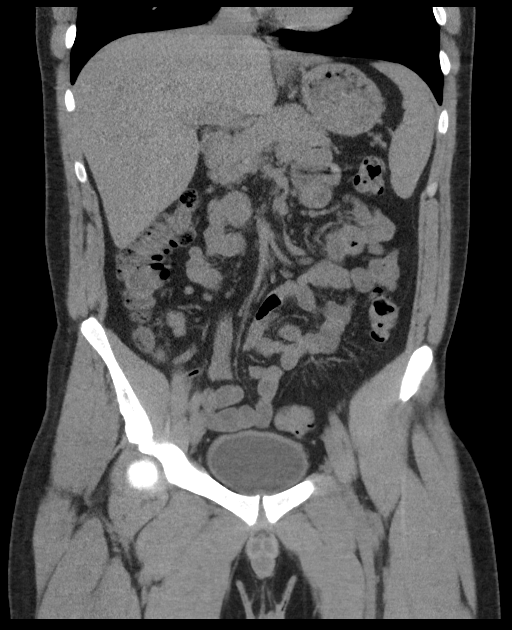
[im 52/94  soft-tissue]
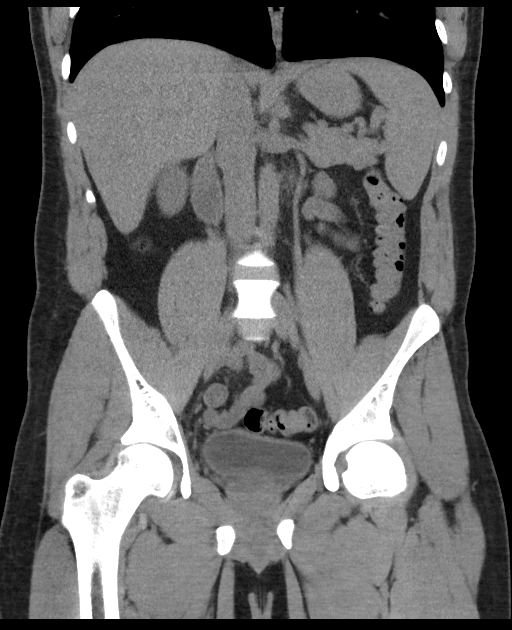

[16 of 46 positions shown; findings below may reference images not displayed]

FINDINGS: Lower chest: No acute abnormality.

Hepatobiliary: No focal liver abnormality is seen. No gallstones,
gallbladder wall thickening, or biliary dilatation.

Pancreas: Unremarkable. No pancreatic ductal dilatation or
surrounding inflammatory changes.

Spleen: Normal in size without focal abnormality.

Adrenals/Urinary Tract: Adrenal glands are unremarkable. Kidneys are
normal, without renal calculi, focal lesion, or hydronephrosis.
Bladder is unremarkable.

Stomach/Bowel: Stomach is within normal limits. Appendix appears
normal. No evidence of bowel wall thickening, distention, or
inflammatory changes.

Vascular/Lymphatic: No significant vascular findings are present. No
enlarged abdominal or pelvic lymph nodes.

Reproductive: Prostate is unremarkable.

Other: Tiny fat containing left inguinal hernia. No free fluid or
pneumoperitoneum.

Musculoskeletal: No acute or significant osseous findings.
IMPRESSION: 1.  No acute intra-abdominal process.  No urolithiasis.

## 2020-04-25 IMAGING — DX LEFT HAND - COMPLETE 3+ VIEW
3 series · 3 of 3 positions shown · non-contrast
Comparison: None.

CLINICAL DATA: Index finger laceration

EXAM:
LEFT HAND - COMPLETE 3+ VIEW

[hand ap]
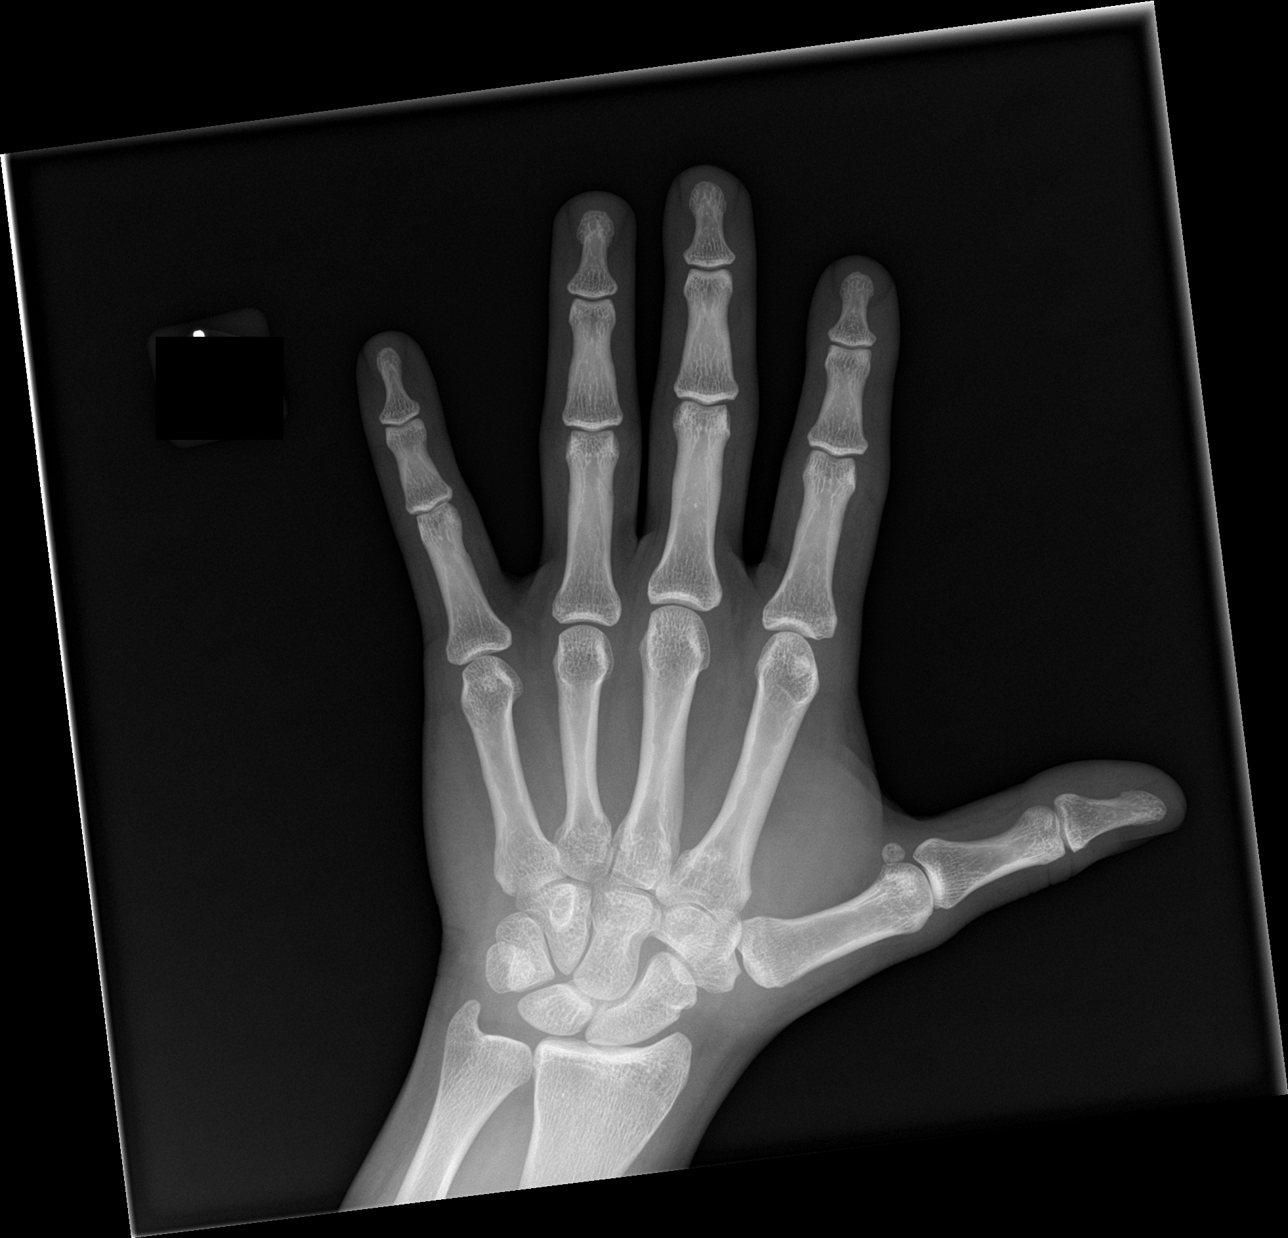

[hand obl]
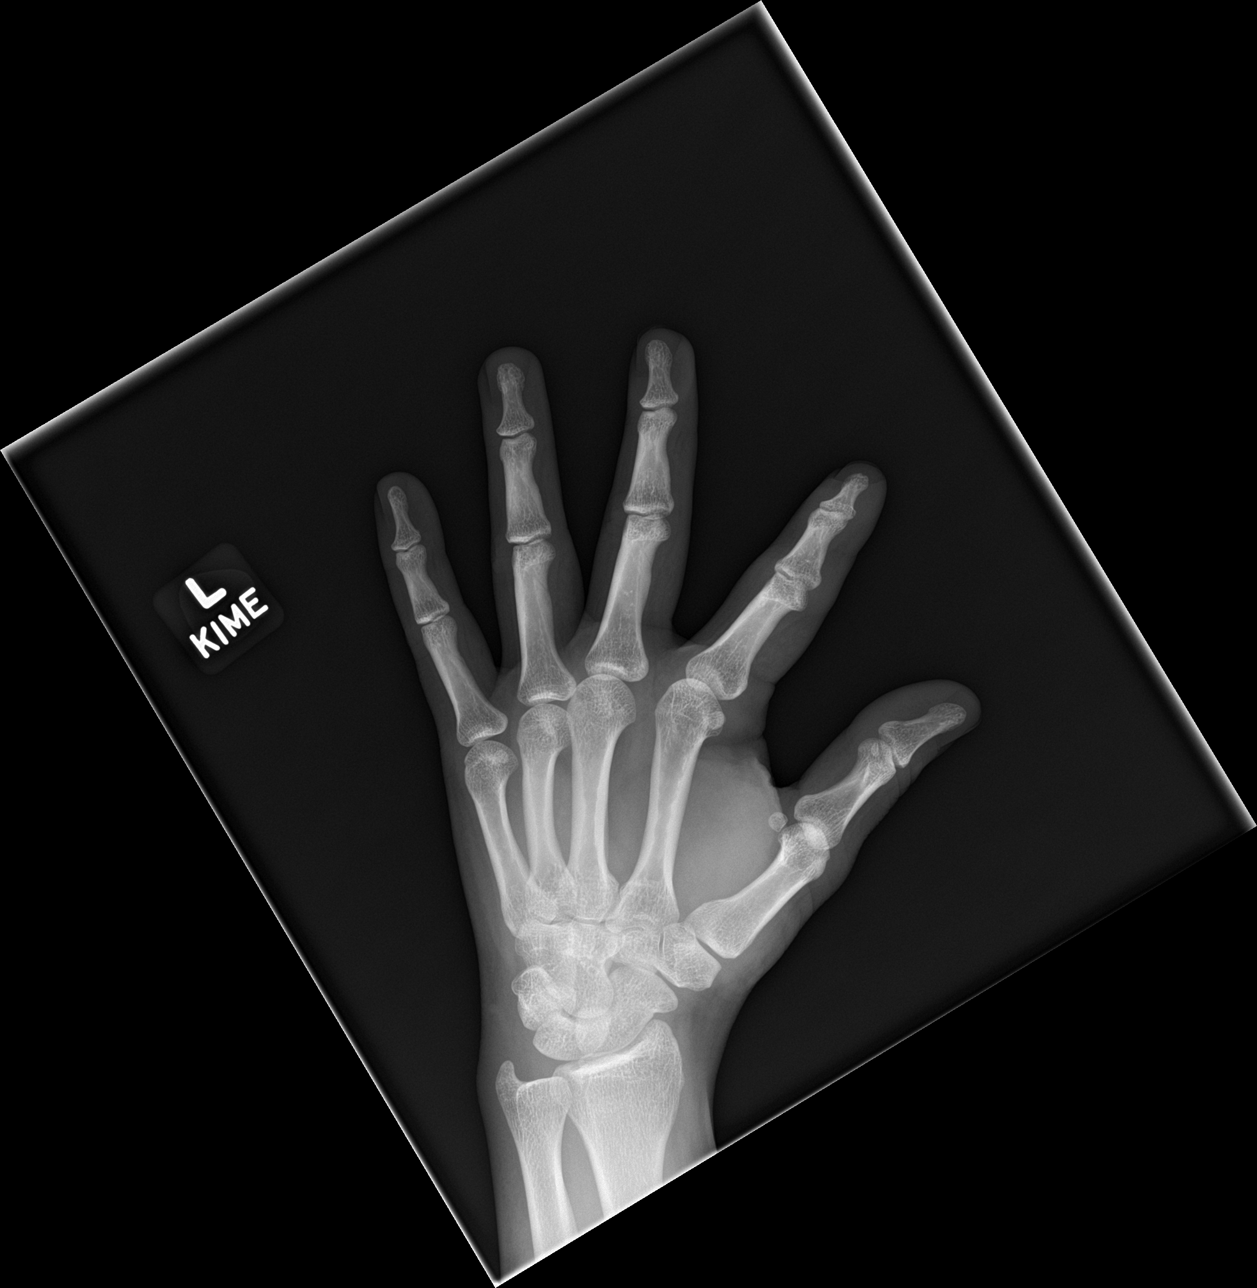

[hand lat]
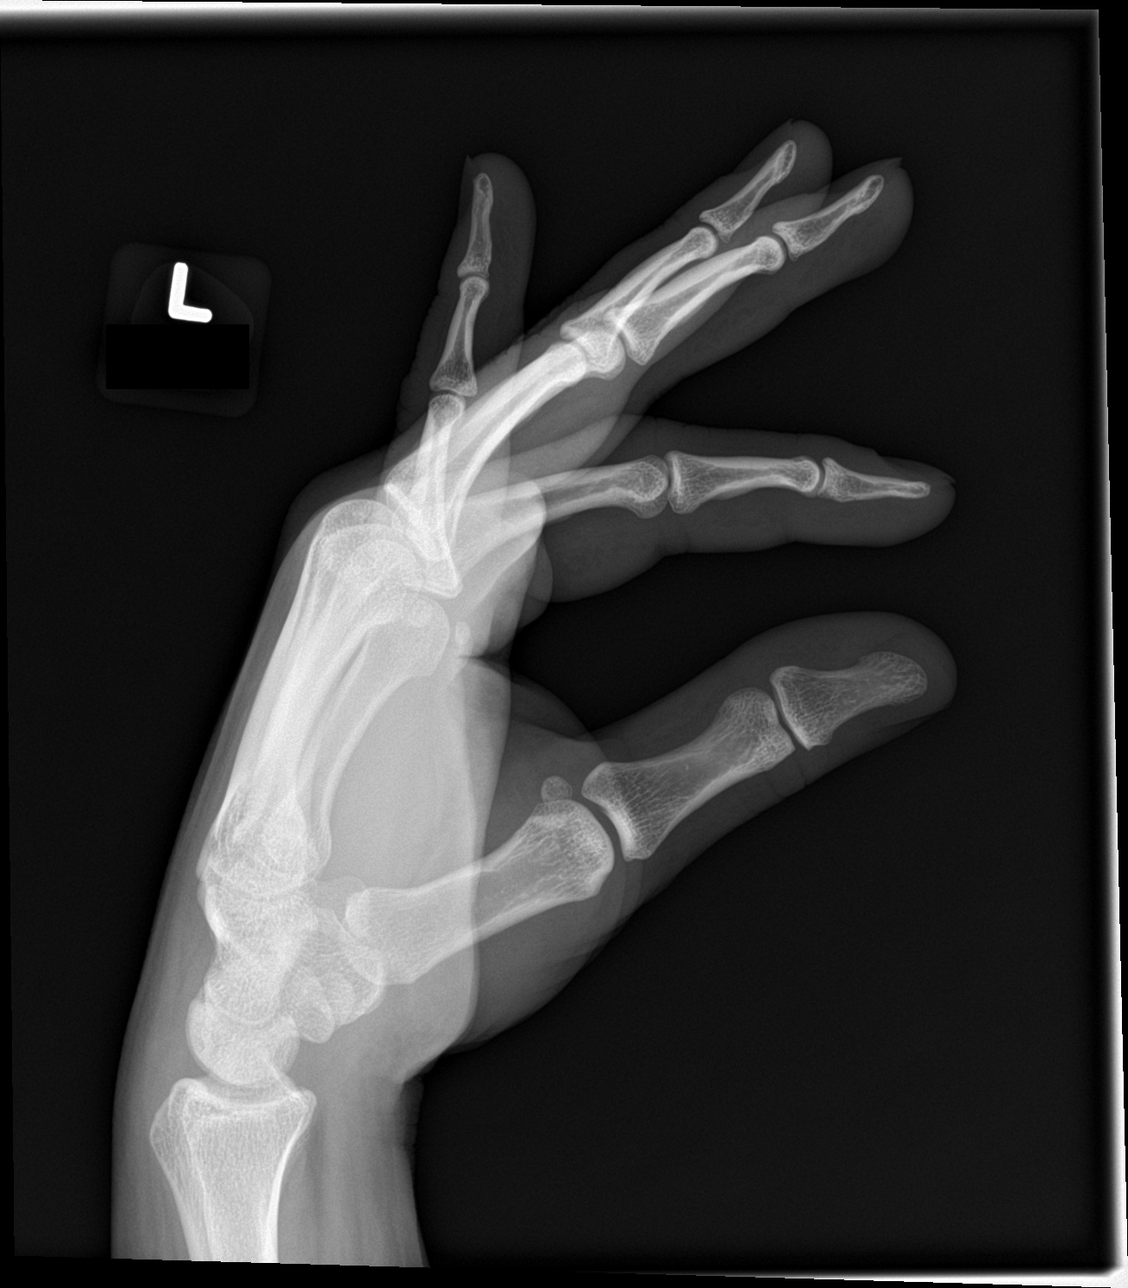

[3 of 3 positions shown; findings below may reference images not displayed]

FINDINGS: No fracture or dislocation of the left hand. Joint spaces are well
preserved. Soft tissue laceration of the index digit with soft
tissue edema. No radiopaque foreign body identified.
IMPRESSION: No fracture or dislocation of the left hand. Joint spaces are well
preserved. Soft tissue laceration of the index digit with soft
tissue edema. No radiopaque foreign body identified.
